# Patient Record
Sex: Male | Born: 1960 | Race: Black or African American | Hispanic: No | Marital: Single | Smoking: Current every day smoker
Health system: Southern US, Community
[De-identification: ages and names within clinical notes are randomized; demographics above are authoritative.]

## PROBLEM LIST (undated history)

## (undated) ENCOUNTER — Emergency Department: Payer: No Typology Code available for payment source

---

## 2004-11-07 ENCOUNTER — Ambulatory Visit: Payer: Self-pay | Admitting: Family Medicine

## 2015-10-17 ENCOUNTER — Emergency Department (HOSPITAL_COMMUNITY)
Admission: EM | Admit: 2015-10-17 | Discharge: 2015-10-17 | Disposition: A | Discharge status: Home / Self Care | Payer: Self-pay | Attending: Emergency Medicine | Admitting: Emergency Medicine

## 2015-10-17 ENCOUNTER — Emergency Department (HOSPITAL_COMMUNITY): Payer: Self-pay

## 2015-10-17 ENCOUNTER — Encounter (HOSPITAL_COMMUNITY): Payer: Self-pay | Admitting: Family Medicine

## 2015-10-17 DIAGNOSIS — R52 Pain, unspecified: Secondary | ICD-10-CM

## 2015-10-17 DIAGNOSIS — N5082 Scrotal pain: Secondary | ICD-10-CM | POA: Insufficient documentation

## 2015-10-17 DIAGNOSIS — F17200 Nicotine dependence, unspecified, uncomplicated: Secondary | ICD-10-CM | POA: Insufficient documentation

## 2015-10-17 MED ORDER — CLOTRIMAZOLE 1 % EX CREA: TOPICAL_CREAM | CUTANEOUS | Status: AC

## 2015-10-17 NOTE — ED Notes (Signed)
Pt here for right groin pain and swelling due to hernia. sts worsening over the past few months.

## 2015-10-17 NOTE — ED Provider Notes (Signed)
CSN: [id number]     Arrival date & time 10/17/15  1024 History   First MD Initiated Contact with Patient 10/17/15 1047     Chief Complaint  Patient presents with  . Hernia     (Consider location/radiation/quality/duration/timing/severity/associated sxs/prior Treatment) HPI  Pt has had months of scrotal swelling on the R - it is constant - seems to be gradually getting worse - was told by the nurse at the [facility] that he had a hernia - he has no f/c/ and no n/v and no dysuria.  The symptoms are persistent, seemed to get worse when he is working and straining.    History reviewed. No pertinent past medical history. History reviewed. No pertinent past surgical history. History reviewed. No pertinent family history. Social History  Substance Use Topics  . Smoking status: Current Every Day Smoker  . Smokeless tobacco: None  . Alcohol Use: Yes     Comment: occ    Review of Systems  Constitutional: Negative for fever.  Genitourinary: Positive for scrotal swelling. Negative for difficulty urinating.  Skin: Negative for rash.      Allergies  Review of patient's allergies indicates no known allergies.  Home Medications   Prior to Admission medications   Not on File   BP 129/72 mmHg  Pulse 62  Temp(Src) 97.9 F (36.6 C)  Ht 5\' 6"  (1.676 m)  Wt 180 lb (81.647 kg)  BMI 29.07 kg/m2  SpO2 94% Physical Exam  Constitutional: He appears well-developed and well-nourished.  HENT:  Head: Normocephalic and atraumatic.  Eyes: Conjunctivae are normal. Right eye exhibits no discharge. Left eye exhibits no discharge.  Pulmonary/Chest: Effort normal. No respiratory distress.  Abdominal:  No abdominal tenderness to palpation, no masses  Genitourinary:  Normal appearing circumcised penis, normal appearing testicles bilaterally without tenderness, scrotum on the left appears normal, scrotum on the right has a proximal lateral lumpiness that is not reducible nor does it protrude with  Valsalva. No induration or redness or drainage, no urethral drainage  Neurological: He is alert. Coordination normal.  Skin: Skin is warm and dry. No rash noted. He is not diaphoretic. No erythema.  Psychiatric: He has a normal mood and affect.  Nursing note and vitals reviewed.   ED Course  Procedures (including critical care time) Labs Review Labs Reviewed - No data to display  Imaging Review [place] Scrotum  10/17/2015  CLINICAL DATA:  Right testicular and groin pain, worsening for 3 months. EXAM: SCROTAL ULTRASOUND DOPPLER ULTRASOUND OF THE TESTICLES TECHNIQUE: Complete ultrasound examination of the testicles, epididymis, and other scrotal structures was performed. Color and spectral Doppler ultrasound were also utilized to evaluate blood flow to the testicles. COMPARISON:  None. FINDINGS: Right testicle Measurements: 4.8 x 2.6 x 3.3 cm. Parenchymal echogenicity is uniform. Color Doppler flow is identified. No microlithiasis. Left testicle Measurements: 5.3 x 2.9 x 3.3 cm. Parenchymal echogenicity is uniform. Color Doppler flow is identified. No microlithiasis. Right epididymis:  Normal in size and appearance. Left epididymis:  Normal in size and appearance. Hydrocele:  None visualized. Varicocele:  None visualized. Pulsed Doppler interrogation of both testes demonstrates normal low resistance arterial and venous waveforms bilaterally. IMPRESSION: Normal exam. Electronically Signed   By: Leanna BattlesMelinda  Blietz M.D.   On: 10/17/2015 11:51   [place] Art/ven Flow Abd Pelv Doppler  10/17/2015  CLINICAL DATA:  Right testicular and groin pain, worsening for 3 months. EXAM: SCROTAL ULTRASOUND DOPPLER ULTRASOUND OF THE TESTICLES TECHNIQUE: Complete ultrasound examination of the testicles, epididymis, and other  scrotal structures was performed. Color and spectral Doppler ultrasound were also utilized to evaluate blood flow to the testicles. COMPARISON:  None. FINDINGS: Right testicle Measurements: 4.8 x 2.6 x 3.3 cm.  Parenchymal echogenicity is uniform. Color Doppler flow is identified. No microlithiasis. Left testicle Measurements: 5.3 x 2.9 x 3.3 cm. Parenchymal echogenicity is uniform. Color Doppler flow is identified. No microlithiasis. Right epididymis:  Normal in size and appearance. Left epididymis:  Normal in size and appearance. Hydrocele:  None visualized. Varicocele:  None visualized. Pulsed Doppler interrogation of both testes demonstrates normal low resistance arterial and venous waveforms bilaterally. IMPRESSION: Normal exam. Electronically Signed   By: Leanna Battles M.D.   On: 10/17/2015 11:51   I have personally reviewed and evaluated these images and lab results as part of my medical decision-making.    MDM   Final diagnoses:  Scrotum pain    Vital signs unremarkable, patient has what appears to be varicocele, hydrocele or some other scrotal pathology, this does not appear consistent with a hernia, we'll order ultrasound to further evaluate. Again there is no hernia palpated on scrotal exam, there is no inguinal masses.  Ultrasound report shows no signs of hydrocele, varicocele or abnormal testicular problems. Patient informed of results, can follow up outpatient, doubt cellulitis or abscess based on clinical exam  Eber Hong, MD 10/17/15 1225

## 2015-10-17 NOTE — Discharge Instructions (Signed)
Your ultrasound was normal - I do not think this is a hernia - please follow up with the Urology group listed above for recheck within the week, come back to the ER for severe or wrosening pain, swelling or fevers.  Please obtain all of your results from medical records or have your doctors office obtain the results - share them with your doctor - you should be seen at your doctors office in the next 2 days. Call today to arrange your follow up. Take the medications as prescribed. Please review all of the medicines and only take them if you do not have an allergy to them. Please be aware that if you are taking birth control pills, taking other prescriptions, ESPECIALLY ANTIBIOTICS may make the birth control ineffective - if this is the case, either do not engage in sexual activity or use alternative methods of birth control such as condoms until you have finished the medicine and your family doctor says it is OK to restart them. If you are on a blood thinner such as COUMADIN, be aware that any other medicine that you take may cause the coumadin to either work too much, or not enough - you should have your coumadin level rechecked in next 7 days if this is the case.  ?  It is also a possibility that you have an allergic reaction to any of the medicines that you have been prescribed - Everybody reacts differently to medications and while MOST people have no trouble with most medicines, you may have a reaction such as nausea, vomiting, rash, swelling, shortness of breath. If this is the case, please stop taking the medicine immediately and contact your physician.  ?  You should return to the ER if you develop severe or worsening symptoms.

## 2015-10-17 NOTE — ED Notes (Signed)
Pt not in room when this RN went to discharge the pt, LVM for pt at  [phone] [facility] for pt to return call & that he left without his discharge instructions & prescription

## 2017-03-13 ENCOUNTER — Encounter (HOSPITAL_COMMUNITY): Payer: Self-pay | Admitting: Emergency Medicine

## 2017-03-13 ENCOUNTER — Ambulatory Visit (HOSPITAL_COMMUNITY)
Admission: EM | Admit: 2017-03-13 | Discharge: 2017-03-13 | Disposition: A | Discharge status: Home / Self Care | Payer: Self-pay | Attending: Family Medicine | Admitting: Family Medicine

## 2017-03-13 ENCOUNTER — Ambulatory Visit (INDEPENDENT_AMBULATORY_CARE_PROVIDER_SITE_OTHER): Payer: Self-pay

## 2017-03-13 DIAGNOSIS — S46911A Strain of unspecified muscle, fascia and tendon at shoulder and upper arm level, right arm, initial encounter: Secondary | ICD-10-CM

## 2017-03-13 DIAGNOSIS — S161XXA Strain of muscle, fascia and tendon at neck level, initial encounter: Secondary | ICD-10-CM

## 2017-03-13 MED ORDER — CYCLOBENZAPRINE HCL 5 MG PO TABS: 5.0000 mg | ORAL_TABLET | Freq: Every day | ORAL | 0 refills | Status: AC

## 2017-03-13 MED ORDER — DICLOFENAC SODIUM 75 MG PO TBEC: 75.0000 mg | DELAYED_RELEASE_TABLET | Freq: Two times a day (BID) | ORAL | 1 refills | Status: AC
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
## 2017-03-13 NOTE — Discharge Instructions (Signed)
Usually these injuries take several days to heal. I've written you a note to keep you from excessive lifting. He may return to work tomorrow.  If the symptoms haven't gotten better by Sunday, please return for recheck.  Your x-rays do not show any fracture or dislocation.

## 2021-10-30 ENCOUNTER — Encounter (HOSPITAL_COMMUNITY): Payer: Self-pay

## 2021-10-30 ENCOUNTER — Emergency Department (HOSPITAL_COMMUNITY): Payer: No Typology Code available for payment source

## 2021-10-30 ENCOUNTER — Other Ambulatory Visit: Payer: Self-pay

## 2021-10-30 ENCOUNTER — Emergency Department (HOSPITAL_COMMUNITY)
Admission: EM | Admit: 2021-10-30 | Discharge: 2021-10-30 | Disposition: A | Discharge status: Home / Self Care | Payer: No Typology Code available for payment source | Attending: Emergency Medicine | Admitting: Emergency Medicine

## 2021-10-30 DIAGNOSIS — R519 Headache, unspecified: Secondary | ICD-10-CM | POA: Diagnosis not present

## 2021-10-30 DIAGNOSIS — R1084 Generalized abdominal pain: Secondary | ICD-10-CM | POA: Insufficient documentation

## 2021-10-30 DIAGNOSIS — F17210 Nicotine dependence, cigarettes, uncomplicated: Secondary | ICD-10-CM | POA: Insufficient documentation

## 2021-10-30 DIAGNOSIS — Y92410 Unspecified street and highway as the place of occurrence of the external cause: Secondary | ICD-10-CM | POA: Diagnosis not present

## 2021-10-30 DIAGNOSIS — M542 Cervicalgia: Secondary | ICD-10-CM | POA: Diagnosis not present

## 2021-10-30 LAB — CBC WITH DIFFERENTIAL/PLATELET
Abs Immature Granulocytes: 0.03 10*3/uL (ref 0.00–0.07)
Basophils Absolute: 0 10*3/uL (ref 0.0–0.1)
Basophils Relative: 1 %
Eosinophils Absolute: 0 10*3/uL (ref 0.0–0.5)
Eosinophils Relative: 0 %
HCT: 43.3 % (ref 39.0–52.0)
Hemoglobin: 15.2 g/dL (ref 13.0–17.0)
Immature Granulocytes: 0 %
Lymphocytes Relative: 41 %
Lymphs Abs: 3.4 10*3/uL (ref 0.7–4.0)
MCH: 32.9 pg (ref 26.0–34.0)
MCHC: 35.1 g/dL (ref 30.0–36.0)
MCV: 93.7 fL (ref 80.0–100.0)
Monocytes Absolute: 0.8 10*3/uL (ref 0.1–1.0)
Monocytes Relative: 9 %
Neutro Abs: 4.2 10*3/uL (ref 1.7–7.7)
Neutrophils Relative %: 49 %
Platelets: 188 10*3/uL (ref 150–400)
RBC: 4.62 MIL/uL (ref 4.22–5.81)
RDW: 14.9 % (ref 11.5–15.5)
WBC: 8.4 10*3/uL (ref 4.0–10.5)
nRBC: 0 % (ref 0.0–0.2)

## 2021-10-30 LAB — COMPREHENSIVE METABOLIC PANEL
ALT: 71 U/L — ABNORMAL HIGH (ref 0–44)
AST: 130 U/L — ABNORMAL HIGH (ref 15–41)
Albumin: 4.5 g/dL (ref 3.5–5.0)
Alkaline Phosphatase: 83 U/L (ref 38–126)
Anion gap: 12 (ref 5–15)
BUN: 5 mg/dL — ABNORMAL LOW (ref 6–20)
CO2: 19 mmol/L — ABNORMAL LOW (ref 22–32)
Calcium: 9.4 mg/dL (ref 8.9–10.3)
Chloride: 100 mmol/L (ref 98–111)
Creatinine, Ser: 0.82 mg/dL (ref 0.61–1.24)
GFR, Estimated: >60 mL/min (ref >60)
Glucose, Bld: 76 mg/dL (ref 70–99)
Potassium: 4 mmol/L (ref 3.5–5.1)
Sodium: 131 mmol/L — ABNORMAL LOW (ref 135–145)
Total Bilirubin: 0.9 mg/dL (ref 0.3–1.2)
Total Protein: 8.7 g/dL — ABNORMAL HIGH (ref 6.5–8.1)

## 2021-10-30 MED ORDER — HYDROCODONE-ACETAMINOPHEN 5-325 MG PO TABS
1.0000 | ORAL_TABLET | Freq: Once | ORAL | Status: AC
  Administered 2021-10-30: 1 via ORAL
  Filled 2021-10-30: qty 1

## 2021-10-30 MED ORDER — IOHEXOL 300 MG/ML  SOLN
100.0000 mL | Freq: Once | INTRAMUSCULAR | Status: AC | PRN
  Administered 2021-10-30: 100 mL via INTRAVENOUS

## 2021-10-30 NOTE — ED Notes (Signed)
Pt teaching provided on medications that may cause drowsiness. Pt instructed not to drive or operate heavy machinery while taking the prescribed medication. Pt verbalized understanding.  ? ?Pt provided discharge instructions and prescription information. Pt was given the opportunity to ask questions and questions were answered. Discharge signature not obtained in the setting of the COVID-19 pandemic in order to reduce high touch surfaces.  ? ?
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
## 2021-10-30 NOTE — Discharge Instructions (Signed)
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
